# Patient Record
Sex: Male | Born: 1990 | Race: White | Hispanic: No | Marital: Single | Smoking: Never smoker
Health system: Southern US, Community
[De-identification: ages and names within clinical notes are randomized; demographics above are authoritative.]

## PROBLEM LIST (undated history)

## (undated) DIAGNOSIS — K219 Gastro-esophageal reflux disease without esophagitis: Secondary | ICD-10-CM

## (undated) HISTORY — PX: TONSILLECTOMY: SUR1361

---

## 2015-06-01 ENCOUNTER — Observation Stay
Admission: EM | Admit: 2015-06-01 | Discharge: 2015-06-02 | Disposition: A | Discharge status: Home / Self Care | Payer: Self-pay | Attending: Internal Medicine | Admitting: Internal Medicine

## 2015-06-01 ENCOUNTER — Encounter: Payer: Self-pay | Admitting: Emergency Medicine

## 2015-06-01 ENCOUNTER — Observation Stay
Admit: 2015-06-01 | Discharge: 2015-06-01 | Disposition: A | Discharge status: Home / Self Care | Payer: Self-pay | Attending: Internal Medicine | Admitting: Internal Medicine

## 2015-06-01 ENCOUNTER — Emergency Department: Payer: Self-pay

## 2015-06-01 DIAGNOSIS — R0602 Shortness of breath: Secondary | ICD-10-CM | POA: Insufficient documentation

## 2015-06-01 DIAGNOSIS — R202 Paresthesia of skin: Secondary | ICD-10-CM | POA: Insufficient documentation

## 2015-06-01 DIAGNOSIS — E860 Dehydration: Secondary | ICD-10-CM | POA: Insufficient documentation

## 2015-06-01 DIAGNOSIS — R55 Syncope and collapse: Secondary | ICD-10-CM | POA: Insufficient documentation

## 2015-06-01 DIAGNOSIS — R531 Weakness: Secondary | ICD-10-CM | POA: Insufficient documentation

## 2015-06-01 DIAGNOSIS — Z9889 Other specified postprocedural states: Secondary | ICD-10-CM | POA: Insufficient documentation

## 2015-06-01 DIAGNOSIS — R42 Dizziness and giddiness: Secondary | ICD-10-CM | POA: Insufficient documentation

## 2015-06-01 DIAGNOSIS — Z8249 Family history of ischemic heart disease and other diseases of the circulatory system: Secondary | ICD-10-CM | POA: Insufficient documentation

## 2015-06-01 DIAGNOSIS — R001 Bradycardia, unspecified: Principal | ICD-10-CM | POA: Diagnosis present

## 2015-06-01 DIAGNOSIS — K219 Gastro-esophageal reflux disease without esophagitis: Secondary | ICD-10-CM | POA: Insufficient documentation

## 2015-06-01 DIAGNOSIS — Z91030 Bee allergy status: Secondary | ICD-10-CM | POA: Insufficient documentation

## 2015-06-01 DIAGNOSIS — F419 Anxiety disorder, unspecified: Secondary | ICD-10-CM | POA: Insufficient documentation

## 2015-06-01 HISTORY — DX: Gastro-esophageal reflux disease without esophagitis: K21.9

## 2015-06-01 LAB — URINALYSIS COMPLETE WITH MICROSCOPIC (ARMC ONLY)
BACTERIA UA: NONE SEEN
Bilirubin Urine: NEGATIVE
Glucose, UA: NEGATIVE mg/dL
HGB URINE DIPSTICK: NEGATIVE
Ketones, ur: NEGATIVE mg/dL
Leukocytes, UA: NEGATIVE
Nitrite: NEGATIVE
PH: 7 (ref 5.0–8.0)
Protein, ur: NEGATIVE mg/dL
SQUAMOUS EPITHELIAL / LPF: NONE SEEN
Specific Gravity, Urine: 1.019 (ref 1.005–1.030)

## 2015-06-01 LAB — BASIC METABOLIC PANEL
ANION GAP: 9 (ref 5–15)
BUN: 11 mg/dL (ref 6–20)
CHLORIDE: 101 mmol/L (ref 101–111)
CO2: 29 mmol/L (ref 22–32)
Calcium: 10.1 mg/dL (ref 8.9–10.3)
Creatinine, Ser: 1.15 mg/dL (ref 0.61–1.24)
GFR calc Af Amer: >60 mL/min (ref >60)
GLUCOSE: 95 mg/dL (ref 65–99)
POTASSIUM: 3.8 mmol/L (ref 3.5–5.1)
Sodium: 139 mmol/L (ref 135–145)

## 2015-06-01 LAB — URINE DRUG SCREEN, QUALITATIVE (ARMC ONLY)
Amphetamines, Ur Screen: NOT DETECTED
BARBITURATES, UR SCREEN: NOT DETECTED
BENZODIAZEPINE, UR SCRN: NOT DETECTED
CANNABINOID 50 NG, UR ~~LOC~~: NOT DETECTED
Cocaine Metabolite,Ur ~~LOC~~: NOT DETECTED
MDMA (Ecstasy)Ur Screen: NOT DETECTED
METHADONE SCREEN, URINE: NOT DETECTED
Opiate, Ur Screen: NOT DETECTED
Phencyclidine (PCP) Ur S: NOT DETECTED
TRICYCLIC, UR SCREEN: NOT DETECTED

## 2015-06-01 LAB — HEMOGLOBIN A1C: Hgb A1c MFr Bld: 5.2 % (ref 4.0–6.0)

## 2015-06-01 LAB — CBC
HEMATOCRIT: 48.4 % (ref 40.0–52.0)
HEMOGLOBIN: 16.2 g/dL (ref 13.0–18.0)
MCH: 28.6 pg (ref 26.0–34.0)
MCHC: 33.5 g/dL (ref 32.0–36.0)
MCV: 85.4 fL (ref 80.0–100.0)
Platelets: 229 10*3/uL (ref 150–440)
RBC: 5.67 MIL/uL (ref 4.40–5.90)
RDW: 13.3 % (ref 11.5–14.5)
WBC: 9.3 10*3/uL (ref 3.8–10.6)

## 2015-06-01 LAB — TROPONIN I: Troponin I: <0.03 ng/mL (ref <0.031)

## 2015-06-01 LAB — TSH: TSH: 1.713 u[IU]/mL (ref 0.350–4.500)

## 2015-06-01 MED ORDER — ONDANSETRON HCL 4 MG PO TABS
4.0000 mg | ORAL_TABLET | Freq: Four times a day (QID) | ORAL | Status: DC | PRN
Start: 2015-06-01 — End: 2015-06-02

## 2015-06-01 MED ORDER — DOCUSATE SODIUM 100 MG PO CAPS
100.0000 mg | ORAL_CAPSULE | Freq: Two times a day (BID) | ORAL | Status: DC
  Administered 2015-06-01: 100 mg via ORAL
  Filled 2015-06-01 (×3): qty 1

## 2015-06-01 MED ORDER — ACETAMINOPHEN 325 MG PO TABS
650.0000 mg | ORAL_TABLET | Freq: Four times a day (QID) | ORAL | Status: DC | PRN
  Administered 2015-06-01: 650 mg via ORAL
  Filled 2015-06-01: qty 2

## 2015-06-01 MED ORDER — SODIUM CHLORIDE 0.9 % IJ SOLN
3.0000 mL | Freq: Two times a day (BID) | INTRAMUSCULAR | Status: DC
  Administered 2015-06-01: 3 mL via INTRAVENOUS

## 2015-06-01 MED ORDER — ENOXAPARIN SODIUM 40 MG/0.4ML ~~LOC~~ SOLN
40.0000 mg | SUBCUTANEOUS | Status: DC
  Administered 2015-06-01: 40 mg via SUBCUTANEOUS
  Filled 2015-06-01: qty 0.4

## 2015-06-01 MED ORDER — ACETAMINOPHEN 650 MG RE SUPP: 650.0000 mg | Freq: Four times a day (QID) | RECTAL | Status: DC | PRN

## 2015-06-01 MED ORDER — SODIUM CHLORIDE 0.9 % IV SOLN
INTRAVENOUS | Status: DC
  Administered 2015-06-01 – 2015-06-02 (×2): via INTRAVENOUS

## 2015-06-01 MED ORDER — ONDANSETRON HCL 4 MG/2ML IJ SOLN: 4.0000 mg | Freq: Four times a day (QID) | INTRAMUSCULAR | Status: DC | PRN
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
## 2015-06-01 NOTE — Progress Notes (Signed)
[facility] [place] [facility]   PATIENT NAME: Nathan Henderson    MR#:  [id number]  DATE OF BIRTH:  September 11, 1991  SUBJECTIVE:  CHIEF COMPLAINT:   Chief Complaint  Patient presents with  . Tingling   Admitted for weakness, dizziness and noted to have a low heart rate of 40's.possible syncope as well. Sinus brady on the monitor, feels tired.  REVIEW OF SYSTEMS:  Review of Systems  Constitutional: Negative for fever and chills.  Respiratory: Negative for cough, hemoptysis, sputum production, shortness of breath and wheezing.   Cardiovascular: Negative for chest pain, palpitations, orthopnea and leg swelling.  Gastrointestinal: Negative for nausea, vomiting, abdominal pain, diarrhea and constipation.  Genitourinary: Negative for dysuria, urgency and frequency.  Musculoskeletal: Negative for myalgias, back pain and neck pain.  Neurological: Positive for weakness. Negative for dizziness, tingling, sensory change, speech change, seizures and headaches.    DRUG ALLERGIES:   Allergies  Allergen Reactions  . Beeswax Swelling    VITALS:  Blood pressure 118/58, pulse 44, temperature 97.7 F (36.5 C), temperature source Oral, resp. rate 18, height  (1.803 m), weight 86.32 kg (190 lb 4.8 oz), SpO2 100 %.  PHYSICAL EXAMINATION:  Physical Exam  GENERAL:  24 y.o.-year-old patient lying in the bed with no acute distress.  EYES: Pupils equal, round, reactive to light and accommodation. No scleral icterus. Extraocular muscles intact.  HEENT: Head atraumatic, normocephalic. Oropharynx and nasopharynx clear.  NECK:  Supple, no jugular venous distention. No thyroid enlargement, no tenderness.  LUNGS: Normal breath sounds bilaterally, no wheezing, rales,rhonchi or crepitation. No use of accessory muscles of respiration.  CARDIOVASCULAR: S1, S2 normal. No murmurs, rubs, or gallops.  ABDOMEN: Soft, nontender, nondistended. Bowel sounds present. No organomegaly  or mass.  EXTREMITIES: No pedal edema, cyanosis, or clubbing.  NEUROLOGIC: Cranial nerves II through XII are intact. Muscle strength 5/5 in all extremities. Sensation intact. Gait not checked.  PSYCHIATRIC: The patient is alert and oriented x 3.  SKIN: No obvious rash, lesion, or ulcer.    LABORATORY PANEL:   CBC  Recent Labs Lab 06/01/15 0255  WBC 9.3  HGB 16.2  HCT 48.4  PLT 229   ------------------------------------------------------------------------------------------------------------------  Chemistries   Recent Labs Lab 06/01/15 0255  NA 139  K 3.8  CL 101  CO2 29  GLUCOSE 95  BUN 11  CREATININE 1.15  CALCIUM 10.1   ------------------------------------------------------------------------------------------------------------------  Cardiac Enzymes  Recent Labs Lab 06/01/15 0255  TROPONINI <0.03   ------------------------------------------------------------------------------------------------------------------  RADIOLOGY:  Dg Chest 2 View  06/01/2015   CLINICAL DATA:  Acute onset of chest tightness and shortness of breath. Initial encounter.  EXAM: CHEST  2 VIEW  COMPARISON:  None.  FINDINGS: The lungs are well-aerated and clear. There is no evidence of focal opacification, pleural effusion or pneumothorax.  The heart is normal in size; the mediastinal contour is within normal limits. No acute osseous abnormalities are seen. There is slight developmental anterior wedging along the lower thoracic spine.  IMPRESSION: No acute cardiopulmonary process seen.   Electronically Signed   By: Roanna Raider M.D.   On: 06/01/2015 04:00    EKG:   Orders placed or performed during the hospital encounter of 06/01/15  . ED EKG within 10 minutes  . ED EKG within 10 minutes  . EKG 12-Lead  . EKG 12-Lead    ASSESSMENT AND PLAN:   24 year old male with no significant past medical history presents to the hospital after a syncopal episode  and noted to be bradycardic  #1  syncope-likely cardiogenic in nature. -Monitor on telemetry. Echocardiogram ordered -Cardiology consulted.  #2 symptomatic bradycardia-sinus bradycardia at this time. Resting heart rate in the 40s to 50s. 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 -Significant family history of cardiac disease and arrhythmias. -Grandmother with sudden cardiac death in 30s. -Cardiology has been consulted.  #3 GERD- not on any treatment now  #4 DVT prophylaxis- lovenox   All the records are reviewed and case discussed with Care Management/Social Workerr. Management plans discussed with the patient, family and they are in agreement.  CODE STATUS: Full Code  TOTAL TIME TAKING CARE OF THIS PATIENT: 36 minutes.   POSSIBLE D/C IN 1-2 DAYS, DEPENDING ON CLINICAL CONDITION.   [name] M.D on 06/01/2015 at 1:49 PM  Between 7am to 6pm - Pager - [phone]  After 6pm go to [email or web address] - password [email or web address] [facility]  [place] [facility]  Office  [phone]  CC: Primary care physician; No PCP Per Patient

## 2015-06-01 NOTE — Care Management Note (Signed)
Case Management Note  Patient Details  Name: Antar Milks MRN: [id number] Date of Birth: Sep 03, 1991  Subjective/Objective:     24yo Kham Zuckerman was admitted to an Observation bed on 06/01/15 with bradycardia. He is uninsured and has no PCP. Mr Colavito was provided with a generic drug discount card, an application to the [facility], and a list of clinics which accept uninsured patients. This Clinical research associate strongly encouraged Mr Hackman to choose a clinic and to make an appointment to see a physician there for affordable outpatient care. Mr Takagi resides with his sister. 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 He uses a moped for transportation. He has no home health assistive equipment. He has no home oxygen. Case management will follow for discharge planning.                 Action/Plan:   Expected Discharge Date:                  Expected Discharge Plan:     In-House Referral:     Discharge planning Services     Post Acute Care Choice:    Choice offered to:     DME Arranged:    DME Agency:     HH Arranged:    HH Agency:     Status of Service:     Medicare Important Message Given:    Date Medicare IM Given:    Medicare IM give by:    Date Additional Medicare IM Given:    Additional Medicare Important Message give by:     If discussed at Long Length of Stay Meetings, dates discussed:    Additional Comments:  Nobie Alleyne A, RN 06/01/2015, 1:14 PM

## 2015-06-01 NOTE — Progress Notes (Signed)
*  PRELIMINARY RESULTS* Echocardiogram 2D Echocardiogram has been performed.  Garrel Ridgel Stills 06/01/2015, 5:00 PM
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
## 2015-06-02 NOTE — Progress Notes (Signed)
Pt alert and oriented x4, no complaints of pain or discomfort.  Bed in low position, call bell within reach.  Bed alarms on and functioning.  Assessment done and charted.  Will continue to monitor and do hourly rounding throughout the shift 

## 2015-06-02 NOTE — Plan of Care (Signed)
Problem: Phase II Progression Outcomes Goal: Progress activity as tolerated unless otherwise ordered Outcome: Completed/Met Date Met:  06/02/15 Ambulated in hallway last evening without distress Goal: Vital signs remain stable Outcome: Not Progressing HR remains bradycardic.  No complaints of dizziness or syncope

## 2015-06-02 NOTE — Progress Notes (Signed)
Discharge: Pt d/c from room via wheelchair, Family member with the pt. Discharge instructions given to the patient and family members.  No questions from pt, reintegrated to the pt to call or go to the ED for chest discomfort. Pt dressed in street clothes and left with discharge papers  in hand. IV d/ced, tele removed and no complaints of pain or discomfort. 
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
## 2015-06-02 NOTE — Discharge Summary (Signed)
[facility] at [facility]   PATIENT NAME: Nathan Henderson    MR#:  [id number]  DATE OF BIRTH:  1991-04-17  DATE OF ADMISSION:  06/01/2015 ADMITTING PHYSICIAN: Arnaldo Natal, MD  DATE OF DISCHARGE: 06/02/2015  PRIMARY CARE PHYSICIAN: No PCP Per Patient    ADMISSION DIAGNOSIS:  Bradycardia [R00.1]  DISCHARGE DIAGNOSIS:  Active Problems:   Sinus bradycardia   SECONDARY DIAGNOSIS:   Past Medical History  Diagnosis Date  . GERD (gastroesophageal reflux disease)     HOSPITAL COURSE:   24 year old male with no significant past medical history presents to the hospital after a syncopal episode and noted to be bradycardic  #1 syncope-likely vasovagal in nature. -Monitored on telemetry. Echocardiogram completely normal with no structural abnormalities -Ambulated, not symptomatic or further syncopal episodes here.  #2 symptomatic bradycardia-patient is athletic. sinus bradycardia at baseline. Resting heart rate in the 40s to 50s. -Significant family history of cardiac disease and arrhythmias. -Appreciate cardiology consult. -His bradycardia has been mostly sinus, without any heart blocks or prolonged intervals. -He is not symptomatic here with his heart rate, because that is his baseline. Recommended to follow up with Dr. Juliann Pares in 2 weeks for possible cardiac monitoring if needed. -Echocardiogram was normal.  #3 GERD- not on any treatment now   so patient is being discharged in a stable condition.  DISCHARGE CONDITIONS:   Stable  CONSULTS OBTAINED:  Treatment Team:  Alwyn Pea, MD  DRUG ALLERGIES:   Allergies  Allergen Reactions  . Beeswax Swelling    DISCHARGE MEDICATIONS:  There are no discharge medications for this patient.    DISCHARGE INSTRUCTIONS:   1. F/u with Dr. Juliann Pares in 2 weeks  If you experience worsening of your admission symptoms, develop shortness of breath, life threatening emergency,  suicidal or homicidal thoughts you must seek medical attention immediately by calling 911 or calling your MD immediately  if symptoms less severe.  You Must read complete instructions/literature along with all the possible adverse reactions/side effects for all the Medicines you take and that have been prescribed to you. Take any new Medicines after you have completely understood and accept all the possible adverse reactions/side effects.   Please note  You were cared for by a hospitalist during your hospital stay. If you have any questions about your discharge medications or the care you received while you were in the hospital after you are discharged, you can call the unit and asked to speak with the hospitalist on call if the hospitalist that took care of you is not available. Once you are discharged, your primary care physician will handle any further medical issues. Please note that NO REFILLS for any discharge medications will be authorized once you are discharged, as it is imperative that you return to your primary care physician (or establish a relationship with a primary care physician if you do not have one) for your aftercare needs so that they can reassess your need for medications and monitor your lab values.    Today   CHIEF COMPLAINT:   Chief Complaint  Patient presents with  . Tingling    VITAL SIGNS:  Blood pressure 105/54, pulse 60, temperature 98.1 F (36.7 C), temperature source Axillary, resp. rate 20, height  (1.803 m), weight 86.32 kg (190 lb 4.8 oz), SpO2 100 %.  I/O:   Intake/Output Summary (Last 24 hours) at 06/02/15 0859 Last data filed at 06/02/15 0550  Gross per 24 hour  Intake  1560 ml  Output   1075 ml  Net    485 ml    PHYSICAL EXAMINATION:   Physical Exam  GENERAL: 24 y.o.-year-old patient lying in the bed with no acute distress.  EYES: Pupils equal, round, reactive to light and accommodation. No scleral icterus. Extraocular muscles  intact.  HEENT: Head atraumatic, normocephalic. Oropharynx and nasopharynx clear.  NECK: Supple, no jugular venous distention. No thyroid enlargement, no tenderness.  LUNGS: Normal breath sounds bilaterally, no wheezing, rales,rhonchi or crepitation. No use of accessory muscles of respiration.  CARDIOVASCULAR: S1, S2 normal. No murmurs, rubs, or gallops.  ABDOMEN: Soft, nontender, nondistended. Bowel sounds present. No organomegaly or mass.  EXTREMITIES: No pedal edema, cyanosis, or clubbing.  NEUROLOGIC: Cranial nerves II through XII are intact. Muscle strength 5/5 in all extremities. Sensation intact. Gait not checked.  PSYCHIATRIC: The patient is alert and oriented x 3.  SKIN: No obvious rash, lesion, or ulcer.   DATA REVIEW:   CBC  Recent Labs Lab 06/01/15 0255  WBC 9.3  HGB 16.2  HCT 48.4  PLT 229    Chemistries   Recent Labs Lab 06/01/15 0255  NA 139  K 3.8  CL 101  CO2 29  GLUCOSE 95  BUN 11  CREATININE 1.15  CALCIUM 10.1    Cardiac Enzymes  Recent Labs Lab 06/01/15 0255  TROPONINI <0.03    Microbiology Results  No results found for this or any previous visit.  RADIOLOGY:  Dg Chest 2 View  06/01/2015   CLINICAL DATA:  Acute onset of chest tightness and shortness of breath. Initial encounter.  EXAM: CHEST  2 VIEW  COMPARISON:  None.  FINDINGS: The lungs are well-aerated and clear. There is no evidence of focal opacification, pleural effusion or pneumothorax.  The heart is normal in size; the mediastinal contour is within normal limits. No acute osseous abnormalities are seen. There is slight developmental anterior wedging along the lower thoracic spine.  IMPRESSION: No acute cardiopulmonary process seen.   Electronically Signed   By: Roanna Raider M.D.   On: 06/01/2015 04:00    EKG:   Orders placed or performed during the hospital encounter of 06/01/15  . ED EKG within 10 minutes  . ED EKG within 10 minutes  . EKG 12-Lead  . EKG 12-Lead       Management plans discussed with the patient, family and they are in agreement.  CODE STATUS:     Code Status Orders        Start     Ordered   06/01/15 0651  Full code   Continuous     06/01/15 0650      TOTAL TIME TAKING CARE OF THIS PATIENT:  36 minutes.    Joselynne Killam M.D on 06/02/2015 at 8:59 AM  Between 7am to 6pm - Pager - [phone]  After 6pm go to [email or web address] - password [email or web address] [facility]  [place] [facility]  Office  [phone]  CC: Primary care physician; No PCP Per Patient

## 2016-03-09 IMAGING — CR DG CHEST 2V
1 series · 2 of 2 positions shown · non-contrast
Comparison: None.

CLINICAL DATA: Acute onset of chest tightness and shortness of
breath. Initial encounter.

EXAM:
CHEST  2 VIEW

[Series 1: dg chest 2 view · 0.14mm/px · 2 of 2 slices shown]
[im 1/2]
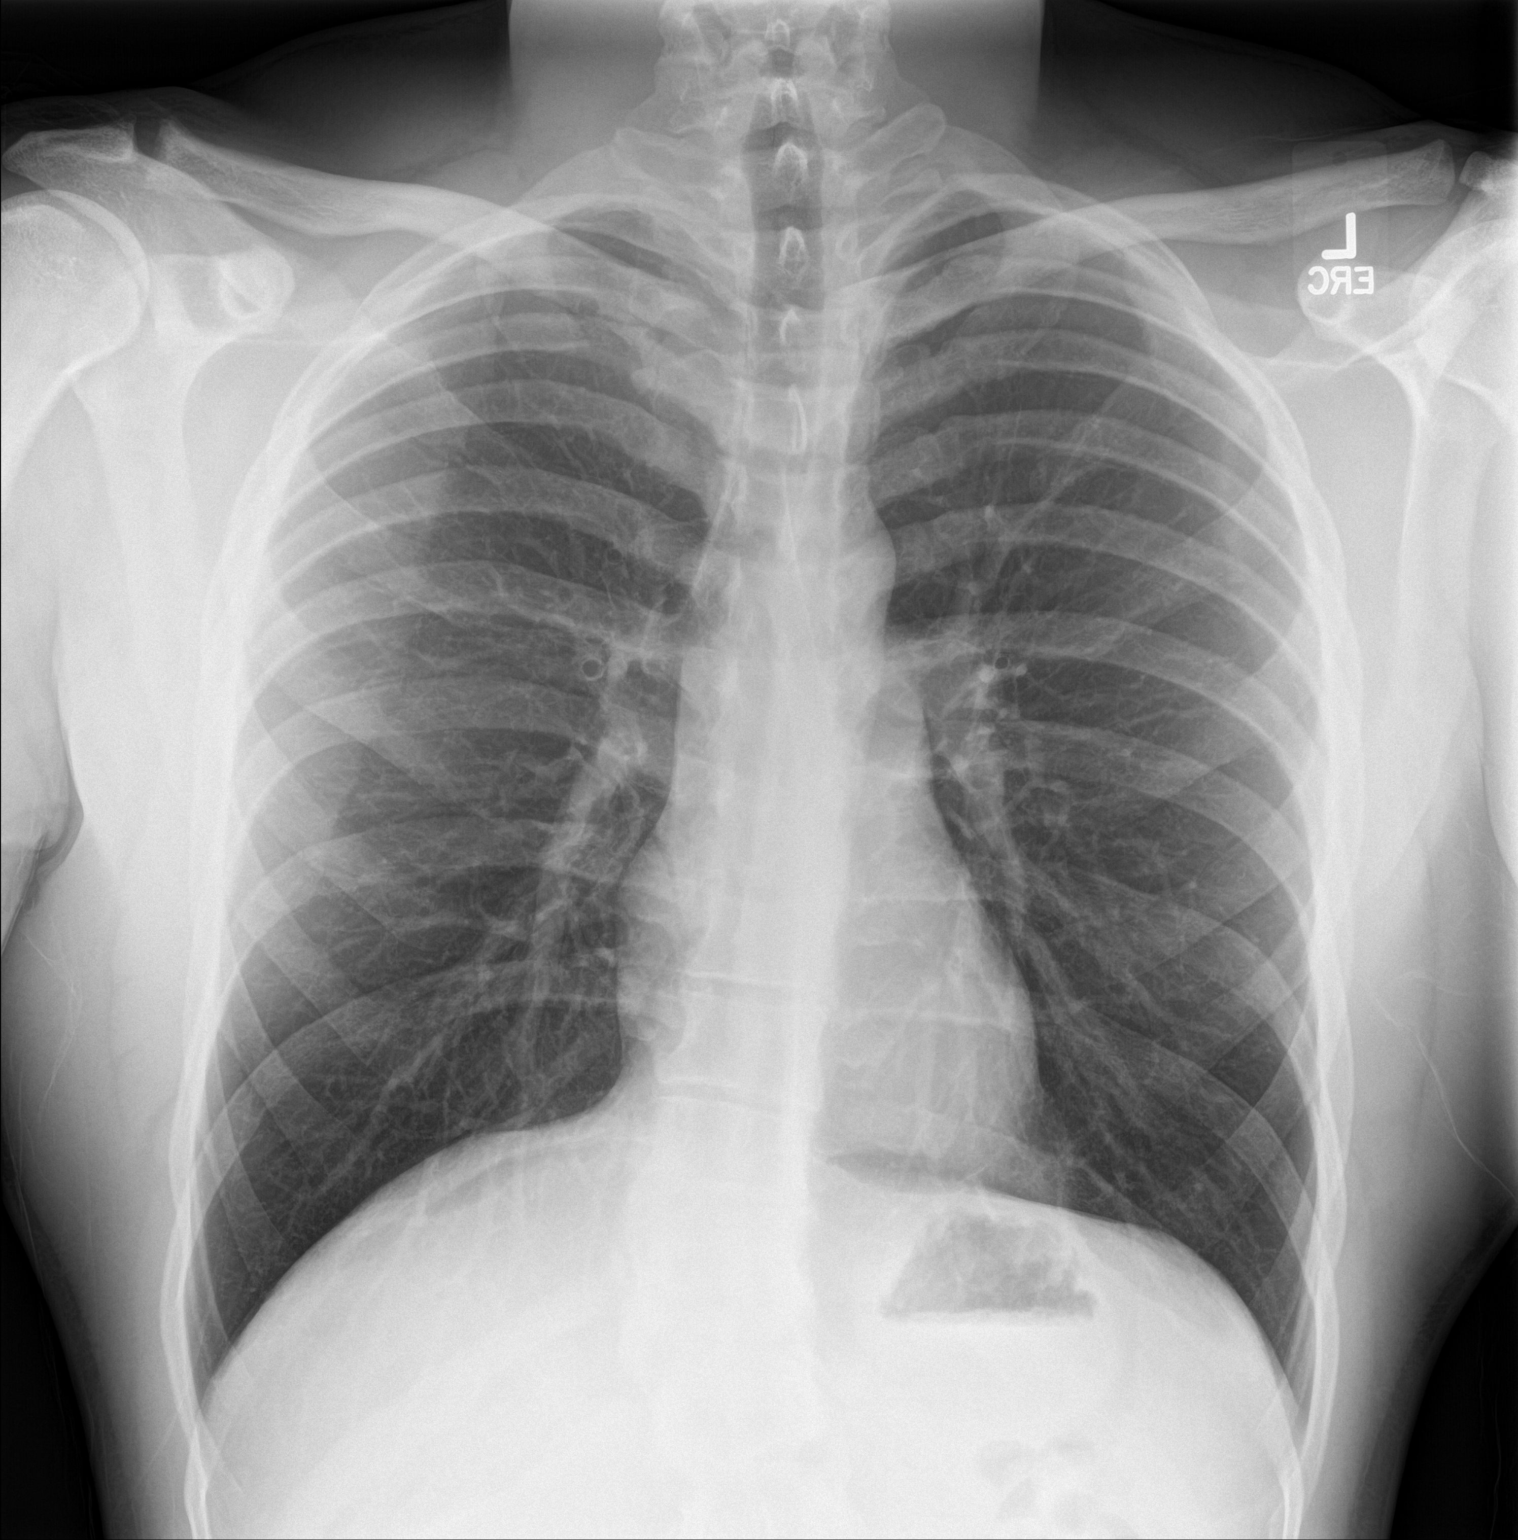
[im 2/2]
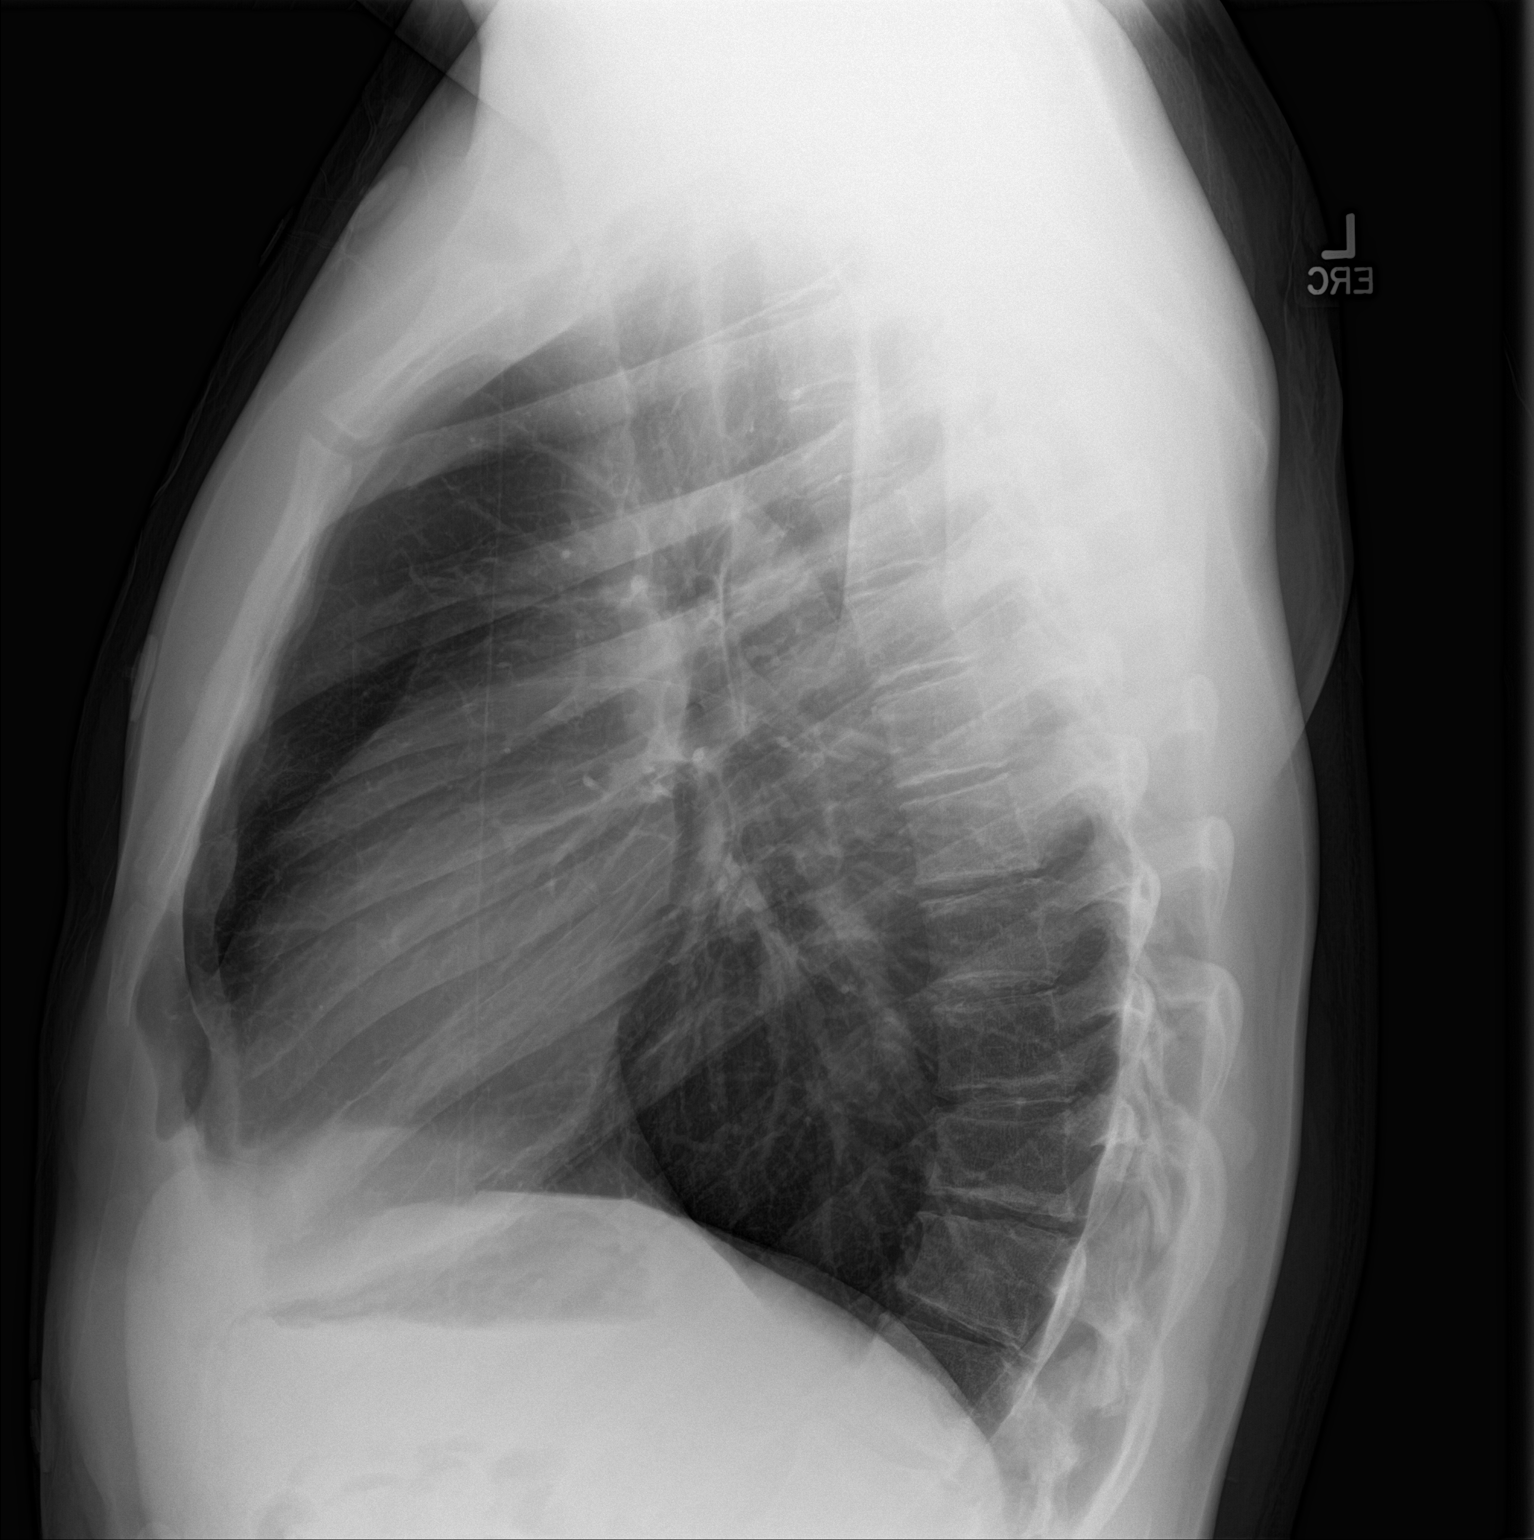

[2 of 2 positions shown; findings below may reference images not displayed]

FINDINGS: The lungs are well-aerated and clear. There is no evidence of focal
opacification, pleural effusion or pneumothorax.

The heart is normal in size; the mediastinal contour is within
normal limits. No acute osseous abnormalities are seen. There is
slight developmental anterior wedging along the lower thoracic
spine.
IMPRESSION: No acute cardiopulmonary process seen.
